# Patient Record
Sex: Female | Born: 1990 | Race: White | Hispanic: No | Marital: Single | Smoking: Former smoker
Health system: Southern US, Community
[De-identification: ages and names within clinical notes are randomized; demographics above are authoritative.]

## PROBLEM LIST (undated history)

## (undated) HISTORY — PX: HERNIA REPAIR: SHX51

---

## 2015-03-17 ENCOUNTER — Emergency Department (HOSPITAL_BASED_OUTPATIENT_CLINIC_OR_DEPARTMENT_OTHER): Payer: Self-pay

## 2015-03-17 ENCOUNTER — Emergency Department (HOSPITAL_BASED_OUTPATIENT_CLINIC_OR_DEPARTMENT_OTHER)
Admission: EM | Admit: 2015-03-17 | Discharge: 2015-03-17 | Disposition: A | Discharge status: Home / Self Care | Payer: Self-pay | Attending: Emergency Medicine | Admitting: Emergency Medicine

## 2015-03-17 ENCOUNTER — Encounter (HOSPITAL_BASED_OUTPATIENT_CLINIC_OR_DEPARTMENT_OTHER): Payer: Self-pay | Admitting: *Deleted

## 2015-03-17 DIAGNOSIS — Z87891 Personal history of nicotine dependence: Secondary | ICD-10-CM | POA: Insufficient documentation

## 2015-03-17 DIAGNOSIS — M79622 Pain in left upper arm: Secondary | ICD-10-CM | POA: Insufficient documentation

## 2015-03-17 DIAGNOSIS — R109 Unspecified abdominal pain: Secondary | ICD-10-CM

## 2015-03-17 DIAGNOSIS — R1013 Epigastric pain: Secondary | ICD-10-CM | POA: Insufficient documentation

## 2015-03-17 DIAGNOSIS — R1012 Left upper quadrant pain: Secondary | ICD-10-CM | POA: Insufficient documentation

## 2015-03-17 DIAGNOSIS — Z3202 Encounter for pregnancy test, result negative: Secondary | ICD-10-CM | POA: Insufficient documentation

## 2015-03-17 DIAGNOSIS — M545 Low back pain: Secondary | ICD-10-CM | POA: Insufficient documentation

## 2015-03-17 LAB — CBC WITH DIFFERENTIAL/PLATELET
Basophils Absolute: 0 10*3/uL (ref 0.0–0.1)
Basophils Relative: 0 %
Eosinophils Absolute: 0.3 10*3/uL (ref 0.0–0.7)
Eosinophils Relative: 4 %
HCT: 40 % (ref 36.0–46.0)
Hemoglobin: 13.3 g/dL (ref 12.0–15.0)
Lymphocytes Relative: 32 %
Lymphs Abs: 2.4 10*3/uL (ref 0.7–4.0)
MCH: 30.7 pg (ref 26.0–34.0)
MCHC: 33.3 g/dL (ref 30.0–36.0)
MCV: 92.4 fL (ref 78.0–100.0)
Monocytes Absolute: 0.9 10*3/uL (ref 0.1–1.0)
Monocytes Relative: 12 %
Neutro Abs: 3.8 10*3/uL (ref 1.7–7.7)
Neutrophils Relative %: 52 %
Platelets: 240 10*3/uL (ref 150–400)
RBC: 4.33 MIL/uL (ref 3.87–5.11)
RDW: 11.4 % — ABNORMAL LOW (ref 11.5–15.5)
WBC: 7.5 10*3/uL (ref 4.0–10.5)

## 2015-03-17 LAB — URINALYSIS, ROUTINE W REFLEX MICROSCOPIC
Bilirubin Urine: NEGATIVE
Glucose, UA: NEGATIVE mg/dL
Hgb urine dipstick: NEGATIVE
Ketones, ur: NEGATIVE mg/dL
Leukocytes, UA: NEGATIVE
Nitrite: NEGATIVE
Protein, ur: NEGATIVE mg/dL
Specific Gravity, Urine: 1.027 (ref 1.005–1.030)
pH: 5.5 (ref 5.0–8.0)

## 2015-03-17 LAB — COMPREHENSIVE METABOLIC PANEL
ALT: 17 U/L (ref 14–54)
AST: 21 U/L (ref 15–41)
Albumin: 4.1 g/dL (ref 3.5–5.0)
Alkaline Phosphatase: 74 U/L (ref 38–126)
Anion gap: 8 (ref 5–15)
BUN: 15 mg/dL (ref 6–20)
CO2: 26 mmol/L (ref 22–32)
Calcium: 9.2 mg/dL (ref 8.9–10.3)
Chloride: 104 mmol/L (ref 101–111)
Creatinine, Ser: 0.87 mg/dL (ref 0.44–1.00)
GFR calc Af Amer: >60 mL/min (ref >60)
GFR calc non Af Amer: >60 mL/min (ref >60)
Glucose, Bld: 113 mg/dL — ABNORMAL HIGH (ref 65–99)
Potassium: 3.8 mmol/L (ref 3.5–5.1)
Sodium: 138 mmol/L (ref 135–145)
Total Bilirubin: 0.6 mg/dL (ref 0.3–1.2)
Total Protein: 7.4 g/dL (ref 6.5–8.1)

## 2015-03-17 LAB — PREGNANCY, URINE: Preg Test, Ur: NEGATIVE

## 2015-03-17 LAB — LIPASE, BLOOD: Lipase: 27 U/L (ref 11–51)

## 2015-03-17 MED ORDER — KETOROLAC TROMETHAMINE 15 MG/ML IJ SOLN
15.0000 mg | Freq: Once | INTRAMUSCULAR | Status: AC
  Administered 2015-03-17: 15 mg via INTRAVENOUS
  Filled 2015-03-17: qty 1

## 2015-03-17 NOTE — ED Provider Notes (Signed)
CSN: [id number]     Arrival date & time 03/17/15  0810 History   First MD Initiated Contact with Patient 03/17/15 0827     Chief Complaint  Patient presents with  . Flank Pain      Patient is a 25 y.o. female presenting with flank pain. The history is provided by the patient. No language interpreter was used.  Flank Pain   Kristin Daugherty is a 25 y.o. female who presents to the Emergency Department complaining of flank pain.  She reports 1 week of left-sided mid lower back pain. The pain is dull and constant in nature, worse with palpation. Yesterday she developed left axillary pain that is described as sharp in nature. This pain is worse with movement and deep breaths. She denies any injuries, fevers, cough, hemoptysis, shortness of breath, abdominal pain, nausea, vomiting, dysuria, hematuria. No prior similar symptoms. She is a nonsmoker and does not take any hormones. Symptoms are moderate, constant, worsening.  History reviewed. No pertinent past medical history. Past Surgical History  Procedure Laterality Date  . Hernia repair      as an infant   No family history on file. Social History  Substance Use Topics  . Smoking status: Former Games developer  . Smokeless tobacco: Never Used  . Alcohol Use: Yes     Comment: occassionally   OB History    No data available     Review of Systems  Genitourinary: Positive for flank pain.  All other systems reviewed and are negative.     Allergies  Review of patient's allergies indicates no known allergies.  Home Medications   Prior to Admission medications   Not on File   BP 144/92 mmHg  Pulse 80  Temp(Src) 98.4 F (36.9 C) (Oral)  Resp 18  Ht  (1.676 m)  Wt 140 lb (63.504 kg)  BMI 22.61 kg/m2  SpO2 100%  LMP 03/03/2015 Physical Exam  Constitutional: She is oriented to person, place, and time. She appears well-developed and well-nourished.  HENT:  Head: Normocephalic and atraumatic.  Cardiovascular: Normal rate and  regular rhythm.   No murmur heard. Pulmonary/Chest: Effort normal and breath sounds normal. No respiratory distress. She exhibits tenderness.  Abdominal: Soft. There is no rebound and no guarding.  Mild tenderness to palpation over the epigastrium and left upper quadrants as well as mild left CVA tenderness.  Musculoskeletal: She exhibits no edema or tenderness.  Neurological: She is alert and oriented to person, place, and time.  Skin: Skin is warm and dry.  Psychiatric: She has a normal mood and affect. Her behavior is normal.  Nursing note and vitals reviewed.   ED Course  Procedures (including critical care time) Labs Review Labs Reviewed  COMPREHENSIVE METABOLIC PANEL - Abnormal; Notable for the following:    Glucose, Bld 113 (*)    All other components within normal limits  URINALYSIS, ROUTINE W REFLEX MICROSCOPIC (NOT AT [facility]) - Abnormal; Notable for the following:    APPearance CLOUDY (*)    All other components within normal limits  CBC WITH DIFFERENTIAL/PLATELET - Abnormal; Notable for the following:    RDW 11.4 (*)    All other components within normal limits  LIPASE, BLOOD  PREGNANCY, URINE    Imaging Review Dg Chest 2 View  03/17/2015  CLINICAL DATA:  25 year old former smoker who developed left upper back pain/posterior chest pain and left rib pain approximately 1 week ago. No known injury. EXAM: CHEST  2 VIEW COMPARISON:  None. FINDINGS:  Cardiomediastinal silhouette unremarkable. Lungs clear. Bronchovascular markings normal. Pulmonary vascularity normal. No pneumothorax. No pleural effusions. Visualized bony thorax intact. IMPRESSION: Normal examination. Electronically Signed   By: Hulan Saas M.D.   On: 03/17/2015 09:48   I have personally reviewed and evaluated these images and lab results as part of my medical decision-making.   EKG Interpretation None      MDM   Final diagnoses:  Acute left flank pain    Patient here for evaluation of the left  flank and axillary pain. Pain is reproducible on examination. History and presentation is not consistent with renal colic, UTI, pancreatitis.  Doubt PE, PERC Negative. No evidence of pneumonia. Discussed with patient home care for musculokeletal flank/chest wall pain. Discussed ibuprofen or Tylenol available over-the-counter, outpatient follow-up, return precautions.    Tilden Fossa, MD 03/17/15 [phone]

## 2015-03-17 NOTE — ED Notes (Signed)
Patient states she has had left mid back pain for one week.  States yesterday, the pain moved to her left upper quadrant of the abdomen.  Denies fever.

## 2015-03-17 NOTE — Discharge Instructions (Signed)
You can take tylenol or ibuprofen, available over the counter as needed for pain.     Flank Pain Flank pain refers to pain that is located on the side of the body between the upper abdomen and the back. The pain may occur over a short period of time (acute) or may be long-term or reoccurring (chronic). It may be mild or severe. Flank pain can be caused by many things. CAUSES  Some of the more common causes of flank pain include:  Muscle strains.   Muscle spasms.   A disease of your spine (vertebral disk disease).   A lung infection (pneumonia).   Fluid around your lungs (pulmonary edema).   A kidney infection.   Kidney stones.   A very painful skin rash caused by the chickenpox virus (shingles).   Gallbladder disease.  HOME CARE INSTRUCTIONS  Home care will depend on the cause of your pain. In general,  Rest as directed by your caregiver.  Drink enough fluids to keep your urine clear or pale yellow.  Only take over-the-counter or prescription medicines as directed by your caregiver. Some medicines may help relieve the pain.  Tell your caregiver about any changes in your pain.  Follow up with your caregiver as directed. SEEK IMMEDIATE MEDICAL CARE IF:   Your pain is not controlled with medicine.   You have new or worsening symptoms.  Your pain increases.   You have abdominal pain.   You have shortness of breath.   You have persistent nausea or vomiting.   You have swelling in your abdomen.   You feel faint or pass out.   You have blood in your urine.  You have a fever or persistent symptoms for more than 2-3 days.  You have a fever and your symptoms suddenly get worse. MAKE SURE YOU:   Understand these instructions.  Will watch your condition.  Will get help right away if you are not doing well or get worse.   This information is not intended to replace advice given to you by your health care provider. Make sure you discuss any  questions you have with your health care provider.   Document Released: 03/17/2005 Document Revised: 10/19/2011 Document Reviewed: 09/08/2011 Elsevier Interactive Patient Education 2016 [employer].   Emergency Department Resource Guide 1) Find a Doctor and Pay Out of Pocket Although you won't have to find out who is covered by your insurance plan, it is a good idea to ask around and get recommendations. You will then need to call the office and see if the doctor you have chosen will accept you as a new patient and what types of options they offer for patients who are self-pay. Some doctors offer discounts or will set up payment plans for their patients who do not have insurance, but you will need to ask so you aren't surprised when you get to your appointment.  2) Contact Your Local Health Department Not all health departments have doctors that can see patients for sick visits, but many do, so it is worth a call to see if yours does. If you don't know where your local health department is, you can check in your phone book. The CDC also has a tool to help you locate your state's health department, and many state websites also have listings of all of their local health departments.  3) Find a Walk-in Clinic If your illness is not likely to be very severe or complicated, you may want to try a  walk in clinic. These are popping up all over the country in pharmacies, drugstores, and shopping centers. They're usually staffed by nurse practitioners or physician assistants that have been trained to treat common illnesses and complaints. They're usually fairly quick and inexpensive. However, if you have serious medical issues or chronic medical problems, these are probably not your best option.  No Primary Care Doctor: - Call Health Connect at  [phone] - they can help you locate a primary care doctor that  accepts your insurance, provides certain services, etc. - Physician Referral Service-  [phone]  Chronic Pain Problems: Organization         Address  Phone   Notes  [facility]  [phone] Patients need to be referred by their primary care doctor.   Medication Assistance: Organization         Address  Phone   Notes  [facility] Medication [facility] [address] [place], [place] [address] [phone] --Must be a resident of [facility] -- Must have NO insurance coverage whatsoever (no Medicaid/ Medicare, etc.) -- The pt. MUST have a primary care doctor that directs their care regularly and follows them in the community   Med[facility]ist  [phone]   [employer]  [phone]    Agencies that provide inexpensive medical care: Organization         Address  Phone   Notes  [facility]  [phone]   [facility]    [phone]   [facility] [address] [place], [place] [address] [phone]   [facility] [address] [place], [place] [phone]   Planned Parenthood    [phone]   [facility]    [phone]   [facility]  [address], [place] Phone:  [phone], Fax:  [phone] Hours of Operation:  9 am - 6 pm, M-F.  Also accepts Medicaid/Medicare and self-pay.  [facility]  [address], [place] Phone: [phone], Fax: [phone]. Hours of Operation:  8:30 am - 5:30 pm, M-F.  Also accepts Medicaid and self-pay.  [facility] [place] [address] [place] Phone: [phone]   [facility] [address] Natasha Bence [place], [place] [phone], Ext. [phone] Mondays & Thursdays: 7-9 AM.  First 15 patients are seen on a first come, first serve basis.    Medicaid-accepting [facility] Providers:  Organization         Address  Phone   Notes  [facility] [address],  [place] [phone] Also accepts self-pay patients.  [facility] [address] Laurell Josephs [place], [place]  [phone]   [facility] [address], [place] [phone]   [facility] [address] [place] [phone]   Renaye Rakers [address], [place]   [phone] Only accepts [place] Access [place] patients after they have their name applied to their card.   Self-Pay (no insurance) in [facility]:  Organization         Address  Phone   Notes  Sickle Cell Patients, [facility] Internal Medicine [address] [place] [address] [place], [place] [phone]   [facility] Care [address] [place], [place] [phone]   Patrcia Dolly  [facility]  [address], [place] [phone]   [facility]/Dr. Osei-Bonsu  [address] [place] [address], [place] or [address], [place] [phone] Phone number for both [place] and [place] locations is the same.  [facility] [address], [place] [phone]) [phone]   [facility] [address] [place] [address], [place] or [address] [phone]) [phone]) [phone]   [facility] [address], [place] [phone]) [phone], phone; [phone], fax Sees patients 1st and 3rd Saturday of every month.  Must not qualify for public or private insurance (i.e. Medicaid, Medicare, [place] Health Choice, Veterans' Benefits)  Household income should be no more than 200% of the poverty level The clinic cannot treat you if you are pregnant or think you are pregnant  Sexually transmitted diseases are not treated at the clinic.    Dental Care: Organization         Address  Phone  Notes  [facility] [address], [place] [phone]) [phone] Accepts children up to age 321 who are enrolled in  [place] or [place] Health Choice; pregnant women with a Medicaid card; and children who have applied for Medicaid or [place] Health Choice, but were declined, whose parents can pay a reduced fee at time of service.  [facility] [place]  [address], [place] [phone]) [phone] Accepts children up to age 221 who are enrolled in [place] or [place] Health Choice; pregnant women with a Medicaid card; and children who have applied for Medicaid or [place] Health Choice, but were declined, whose parents can pay a reduced fee at time of service.  [facility]  [address], [place] [phone]) [phone] Patients are seen by appointment only. Walk-ins are not accepted. [facility] will see patients 25 years of age and older. Monday - Tuesday (8am-5pm) Most Wednesdays (8:30-5pm) $30 per visit, cash only  [facility]  [address], [place] [facility] [phone]) [phone] Patients are seen by appointment only. Walk-ins are not accepted. [facility] will see patients 25 years of age and older. One Wednesday Evening (Monthly: Volunteer Based).  $30 per visit, cash only  [employer] [facility]  [phone] for adults; Children under age 614, call Graduate Pediatric Dentistry at [phone]) [phone]. Children aged 44-14, please call [phone] to request a pediatric application.  Dental services are provided in all areas of dental care including fillings, crowns and bridges, complete and partial dentures, implants, gum treatment, root canals, and extractions. Preventive care is also provided. Treatment is provided to both adults and children. Patients are selected via a lottery and there is often a waiting list.   [facility] [address], [place]  [phone]) [phone] [email or web address]   [facility] [address], [place], [place] [phone], Ext. [phone] Second and Fourth Thursday of each month, opens at 6:30  AM; Clinic ends at 9 AM.  Patients are seen on a first-come first-served basis, and a limited number are seen during each clinic.   [facility]  [address], [place], [place] [phone]) [phone]   Eligibility Requirements You must have lived in [place], [place], or [place] counties for at least the last three months.   You cannot be eligible for state or federal sponsored healthcare  insurance, including CIGNA, [place], or [employer].   You generally cannot be eligible for healthcare insurance through your employer.    How to apply: Eligibility screenings are held every Tuesday and Wednesday afternoon from 1:00 pm until 4:00 pm. You do not need an appointment for the interview!  [facility] [address], [place], [place] [phone]   [facility] Health Department  [phone]   [facility] Health Department  [phone]   [facility] Health Department  [phone]    Behavioral Health Resources in the Community: Intensive Outpatient Programs Organization         Address  Phone  Notes  [facility] [address] [place], [place] [phone]   [facility] Outpatient [address] [place], [place] [phone]   [facility] [address] [place], [place]  [phone]   [facility] [address],  [place], [place] [phone] or [phone]   Substance Abuse Resources Organization         Address  Phone  Notes  [facility] Drug Services  [phone]   [facility]  [phone]   [facility] [place]  [phone]   Floydene Flock  [phone]   Residential & Outpatient Substance Abuse Program  [phone]   Psychological Services Organization         Address  Phone  Notes  [facility] Health  [phone]   [facility]  [phone]   [facility] Health [address], [place] [phone] or  [phone]    Mobile Crisis Teams Organization         Address  Phone  Notes  [facility], Mobile Crisis Care Unit  [phone]   [facility]  [address] [place], [place] [phone]   Doristine Locks [address] Ali [place] [phone]    Self-Help/Support Groups Organization         Address  Phone             Notes  [facility] [place] - variety of support groups  336- I7437963 Call for more information  Narcotics Anonymous (NA), [facility]aring [facility] [address], [employer] [place]  2 meetings at this location   Statistician         Address  Phone  Notes  [facility] [address],    [place]  [phone]   [facility]  [address], [place] [address], [place], [place] [phone]   [facility] [address] [place], [place] [phone] Admissions: 8am-3pm M-F  [facility] [address],    [place], [place] [phone]   [facility] [address] [place], [place], [place] [phone]   [facility] [address]  [place], [place] [phone]   [facility] - Intensive Outpatient [address]urell Josephs [address], [place], [place] [phone]   [facility] ([facility]iction [facility] [address] [place]  [place], [place] [phone] or [phone]   [facility] ([facility]) [address] [place], [place] [phone] Accepts Medicaid  [facility] [address],  [place] [phone] Substance Abuse/Addiction Treatment   [facility] Organization         Address  Phone  Notes  [facility]  [phone]   Angie Fava, PhD [address] [place], [place]   [phone] or [phone]) [phone])  [phone]   [facility]   [address] Zamariah [place], [place] [phone]   [facility] [address]  [place], [place] [phone] Insurance/Medicaid/sponsorship through [facility] Families [address]                                    [place], [place] [phone] Therapy/tele-psych/case  [facility] [address]   [place], [place] [phone]    Dr. Lolly Mustache  [phone]   [facility]  [employer] [facility] Dept. 1) [address], [place] 2) [address] [place] 3)  [address], [place] [phone]  [phone]   [facility] Child Abuse Hotline 845-558-1947 or [phone] (After Hours)

## 2016-05-23 ENCOUNTER — Emergency Department (INDEPENDENT_AMBULATORY_CARE_PROVIDER_SITE_OTHER): Payer: Self-pay

## 2016-05-23 ENCOUNTER — Other Ambulatory Visit: Payer: Self-pay

## 2016-05-23 ENCOUNTER — Emergency Department
Admission: EM | Admit: 2016-05-23 | Discharge: 2016-05-23 | Disposition: A | Discharge status: Home / Self Care | Payer: Self-pay | Source: Home / Self Care | Attending: Family Medicine | Admitting: Family Medicine

## 2016-05-23 DIAGNOSIS — M940 Chondrocostal junction syndrome [Tietze]: Secondary | ICD-10-CM

## 2016-05-23 DIAGNOSIS — R0789 Other chest pain: Secondary | ICD-10-CM

## 2016-05-23 MED ORDER — PREDNISONE 20 MG PO TABS: ORAL_TABLET | ORAL | 0 refills | Status: AC

## 2016-05-23 NOTE — ED Triage Notes (Signed)
Pt stated that she has had chest tightness for about 1 week.  When it happens, she feels like she can not take a deep breath, and has blurry vision.  Denies history of asthma, and cold symptoms.  Denies fever.

## 2016-05-23 NOTE — ED Provider Notes (Signed)
[facility]    CSN: [id number] Arrival date & time: 05/23/16  1929     History   Chief Complaint Chief Complaint  Patient presents with  . Chest Pain    HPI Kristin Daugherty is a 26 y.o. female.   Patient complains of one week history of constant tightness in her anterior chest over her sternum, and feels like she cannot take a deep breath although she does not feel shortness of breath.  She feels well otherwise.  No cough or recent cold.  No history of asthma.  No chest injury.   The history is provided by the patient.  Chest Pain  Pain location:  Substernal area Pain quality: tightness   Pain radiates to:  Does not radiate Pain severity:  Mild Onset quality:  Sudden Duration:  1 week Timing:  Constant Progression:  Unchanged Chronicity:  New Context: breathing   Relieved by:  Nothing Worsened by:  Movement and deep breathing Ineffective treatments:  None tried Associated symptoms: no abdominal pain, no AICD problem, no anorexia, no anxiety, no cough, no diaphoresis, no dizziness, no dysphagia, no fatigue, no fever, no heartburn, no lower extremity edema, no nausea, no palpitations, no PND, no shortness of breath, no syncope and no vomiting     History reviewed. No pertinent past medical history.  There are no active problems to display for this patient.   Past Surgical History:  Procedure Laterality Date  . HERNIA REPAIR     as an infant    OB History    No data available       Home Medications    Prior to Admission medications   Medication Sig Start Date End Date Taking? Authorizing Provider  predniSONE (DELTASONE) 20 MG tablet Take one tab by mouth twice daily for 5 days, then one daily for 3 days. Take with food. 05/23/16   Lattie Haw, MD    Family History History reviewed. No pertinent family history.  Social History Social History  Substance Use Topics  . Smoking status: Former Games developer  . Smokeless tobacco: Never Used  .  Alcohol use Yes     Comment: occassionally     Allergies   Patient has no known allergies.   Review of Systems Review of Systems  Constitutional: Negative for diaphoresis, fatigue and fever.  HENT: Negative for trouble swallowing.   Respiratory: Negative for cough and shortness of breath.   Cardiovascular: Positive for chest pain. Negative for palpitations, syncope and PND.  Gastrointestinal: Negative for abdominal pain, anorexia, heartburn, nausea and vomiting.  Neurological: Negative for dizziness.  All other systems reviewed and are negative.    Physical Exam Triage Vital Signs ED Triage Vitals  Enc Vitals Group     BP 05/23/16 2011 (!) 141/83     Pulse Rate 05/23/16 2011 64     Resp --      Temp 05/23/16 2011 98 F (36.7 C)     Temp Source 05/23/16 2011 Oral     SpO2 05/23/16 2011 100 %     Weight 05/23/16 2011 166 lb (75.3 kg)     Height 05/23/16 2011  (1.676 m)     Head Circumference --      Peak Flow --      Pain Score 05/23/16 2012 0     Pain Loc --      Pain Edu? --      Excl. in GC? --    No data found.  Updated Vital Signs BP (!) 141/83 (BP Location: Left Arm)   Pulse 64   Temp 98 F (36.7 C) (Oral)   Ht  (1.676 m)   Wt 166 lb (75.3 kg)   LMP 05/22/2016   SpO2 100%   BMI 26.79 kg/m   Visual Acuity Right Eye Distance:   Left Eye Distance:   Bilateral Distance:    Right Eye Near:   Left Eye Near:    Bilateral Near:     Physical Exam  Constitutional: She appears well-developed and well-nourished. No distress.  HENT:  Head: Normocephalic.  Right Ear: External ear normal.  Left Ear: External ear normal.  Nose: Nose normal.  Mouth/Throat: Oropharynx is clear and moist.  Eyes: Conjunctivae are normal. Pupils are equal, round, and reactive to light.  Neck: Neck supple.  Cardiovascular: Normal heart sounds.   Pulmonary/Chest: Breath sounds normal. She exhibits tenderness.    Chest:  Distinct tenderness to palpation over the  mid-sternum.   Abdominal: There is no tenderness.  Lymphadenopathy:    She has no cervical adenopathy.  Neurological: She is alert.  Skin: Skin is warm and dry. No rash noted.  Nursing note and vitals reviewed.    UC Treatments / Results  Labs (all labs ordered are listed, but only abnormal results are displayed) Labs Reviewed - No data to display  EKG  EKG Interpretation  Rate:  67 BPM PR:  164 msec QT:  406 msec QTcH:  429 msec QRSD:  114 msec QRS axis:  56 degrees Interpretation:  within normal limits        Radiology Dg Chest 2 View  Result Date: 05/23/2016 CLINICAL DATA:  Chest tightness for 1 week. EXAM: CHEST  2 VIEW COMPARISON:  Chest radiograph March 17, 2015 FINDINGS: Cardiomediastinal silhouette is normal. No pleural effusions or focal consolidations. Trachea projects midline and there is no pneumothorax. Soft tissue planes and included osseous structures are non-suspicious. IMPRESSION: Normal chest. Electronically Signed   By: Awilda Metro M.D.   On: 05/23/2016 20:39    Procedures Procedures (including critical care time)  Medications Ordered in UC Medications - No data to display   Initial Impression / Assessment and Plan / UC Course  I have reviewed the triage vital signs and the nursing notes.  Pertinent labs & imaging results that were available during my care of the patient were reviewed by me and considered in my medical decision making (see chart for details).    Normal EKG and Chest X-ray reassuring. Begin prednisone burst/taper. Apply ice pack for 20 to 30 minutes, 3 to 4 times daily  Continue until pain decreases.  May take Tylenol if needed for pain. Followup with Dr. Rodney Langton or Dr. Clementeen [facility] ([facility]) if not improving about two weeks.     Final Clinical Impressions(s) / UC Diagnoses   Final diagnoses:  Costochondritis, acute    New Prescriptions New Prescriptions   PREDNISONE (DELTASONE) 20 MG  TABLET    Take one tab by mouth twice daily for 5 days, then one daily for 3 days. Take with food.     Lattie Haw, MD 06/04/16 1349

## 2016-05-23 NOTE — Discharge Instructions (Signed)
Apply ice pack for 20 to 30 minutes, 3 to 4 times daily  Continue until pain decreases. May take Tylenol if needed for pain. 

## 2017-07-24 IMAGING — DX DG CHEST 2V
2 series · 2 of 2 positions shown · non-contrast
Comparison: None.

CLINICAL DATA: 25-year-old former smoker who developed left upper
back pain/posterior chest pain and left rib pain approximately 1
week ago. No known injury.

EXAM:
CHEST  2 VIEW

[chest pa]
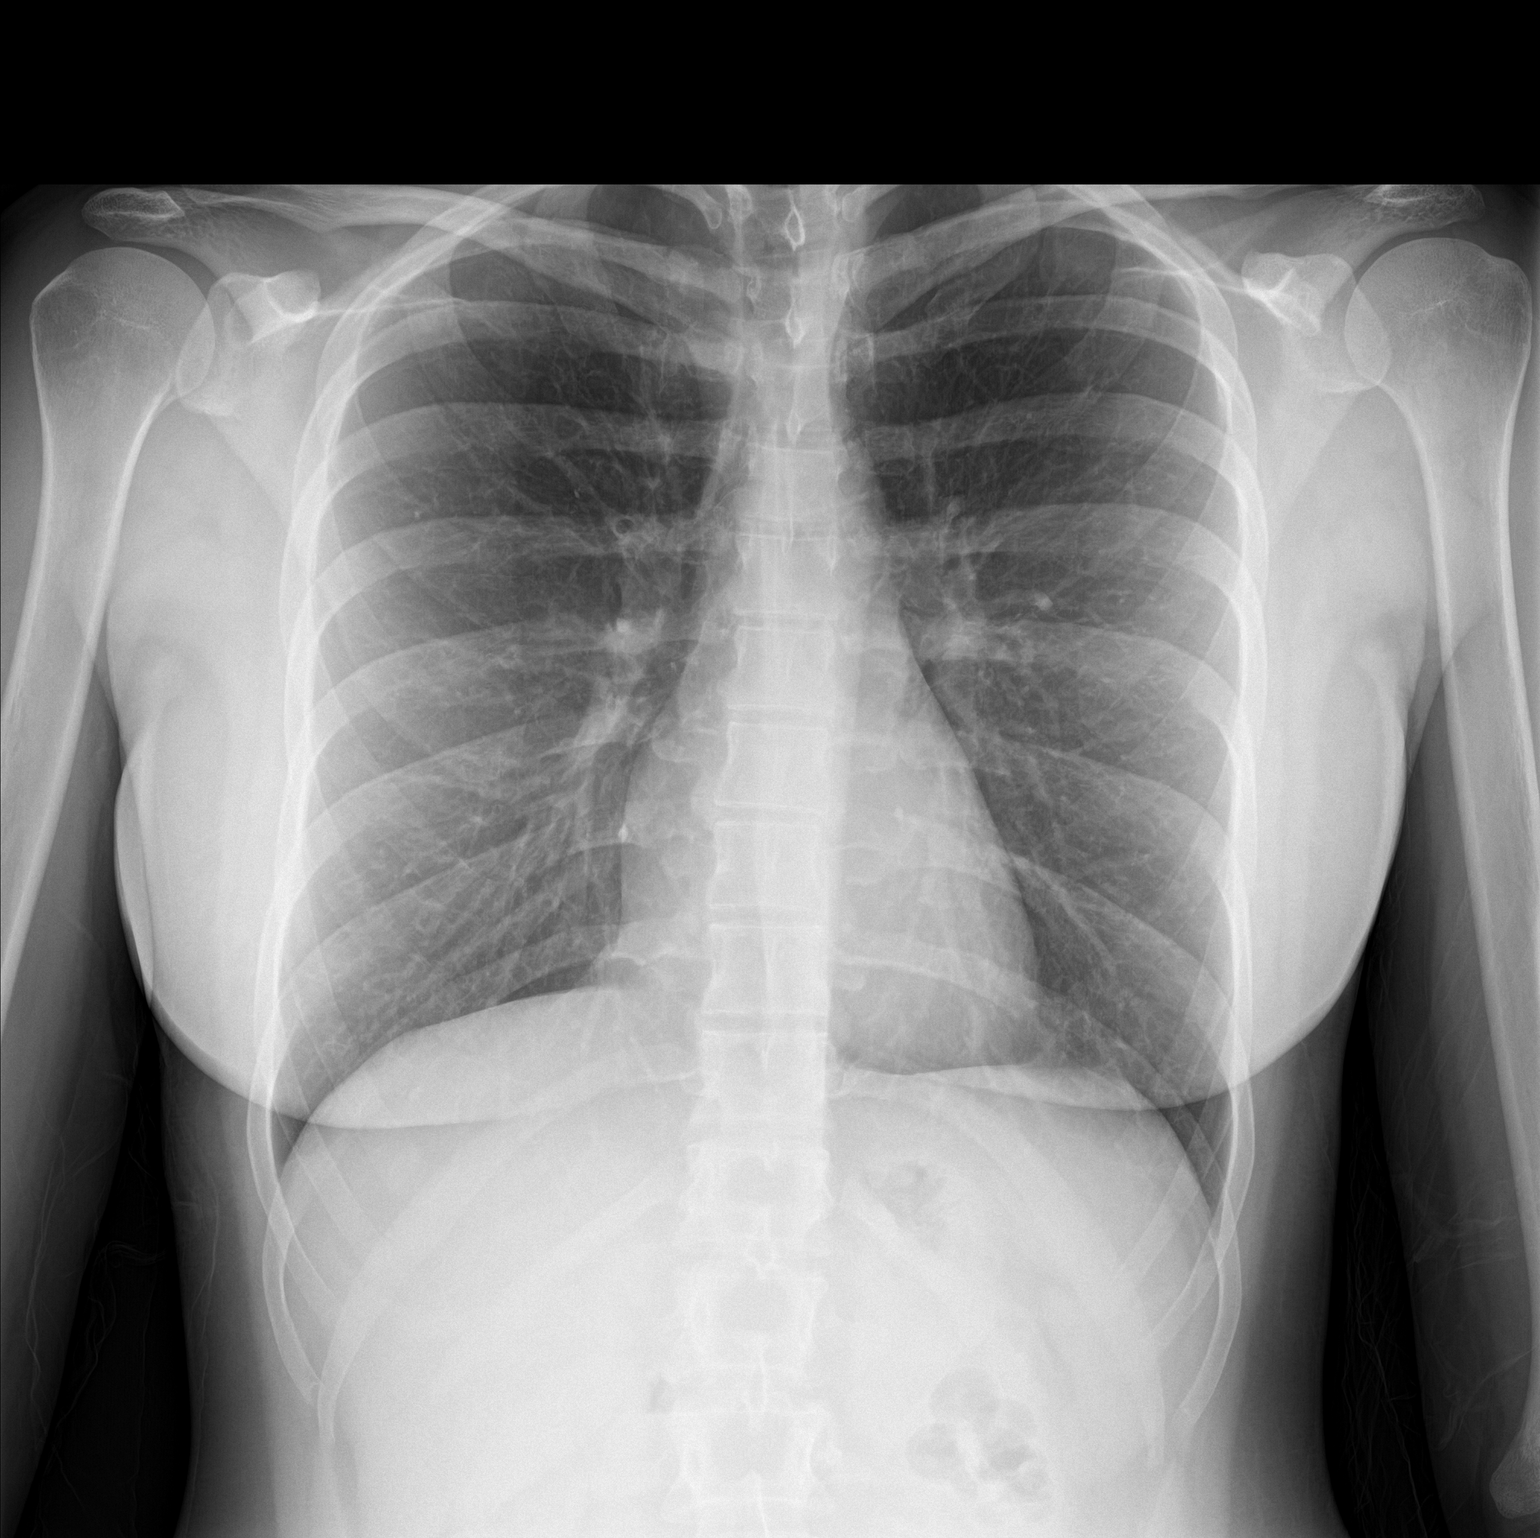

[chest lat]
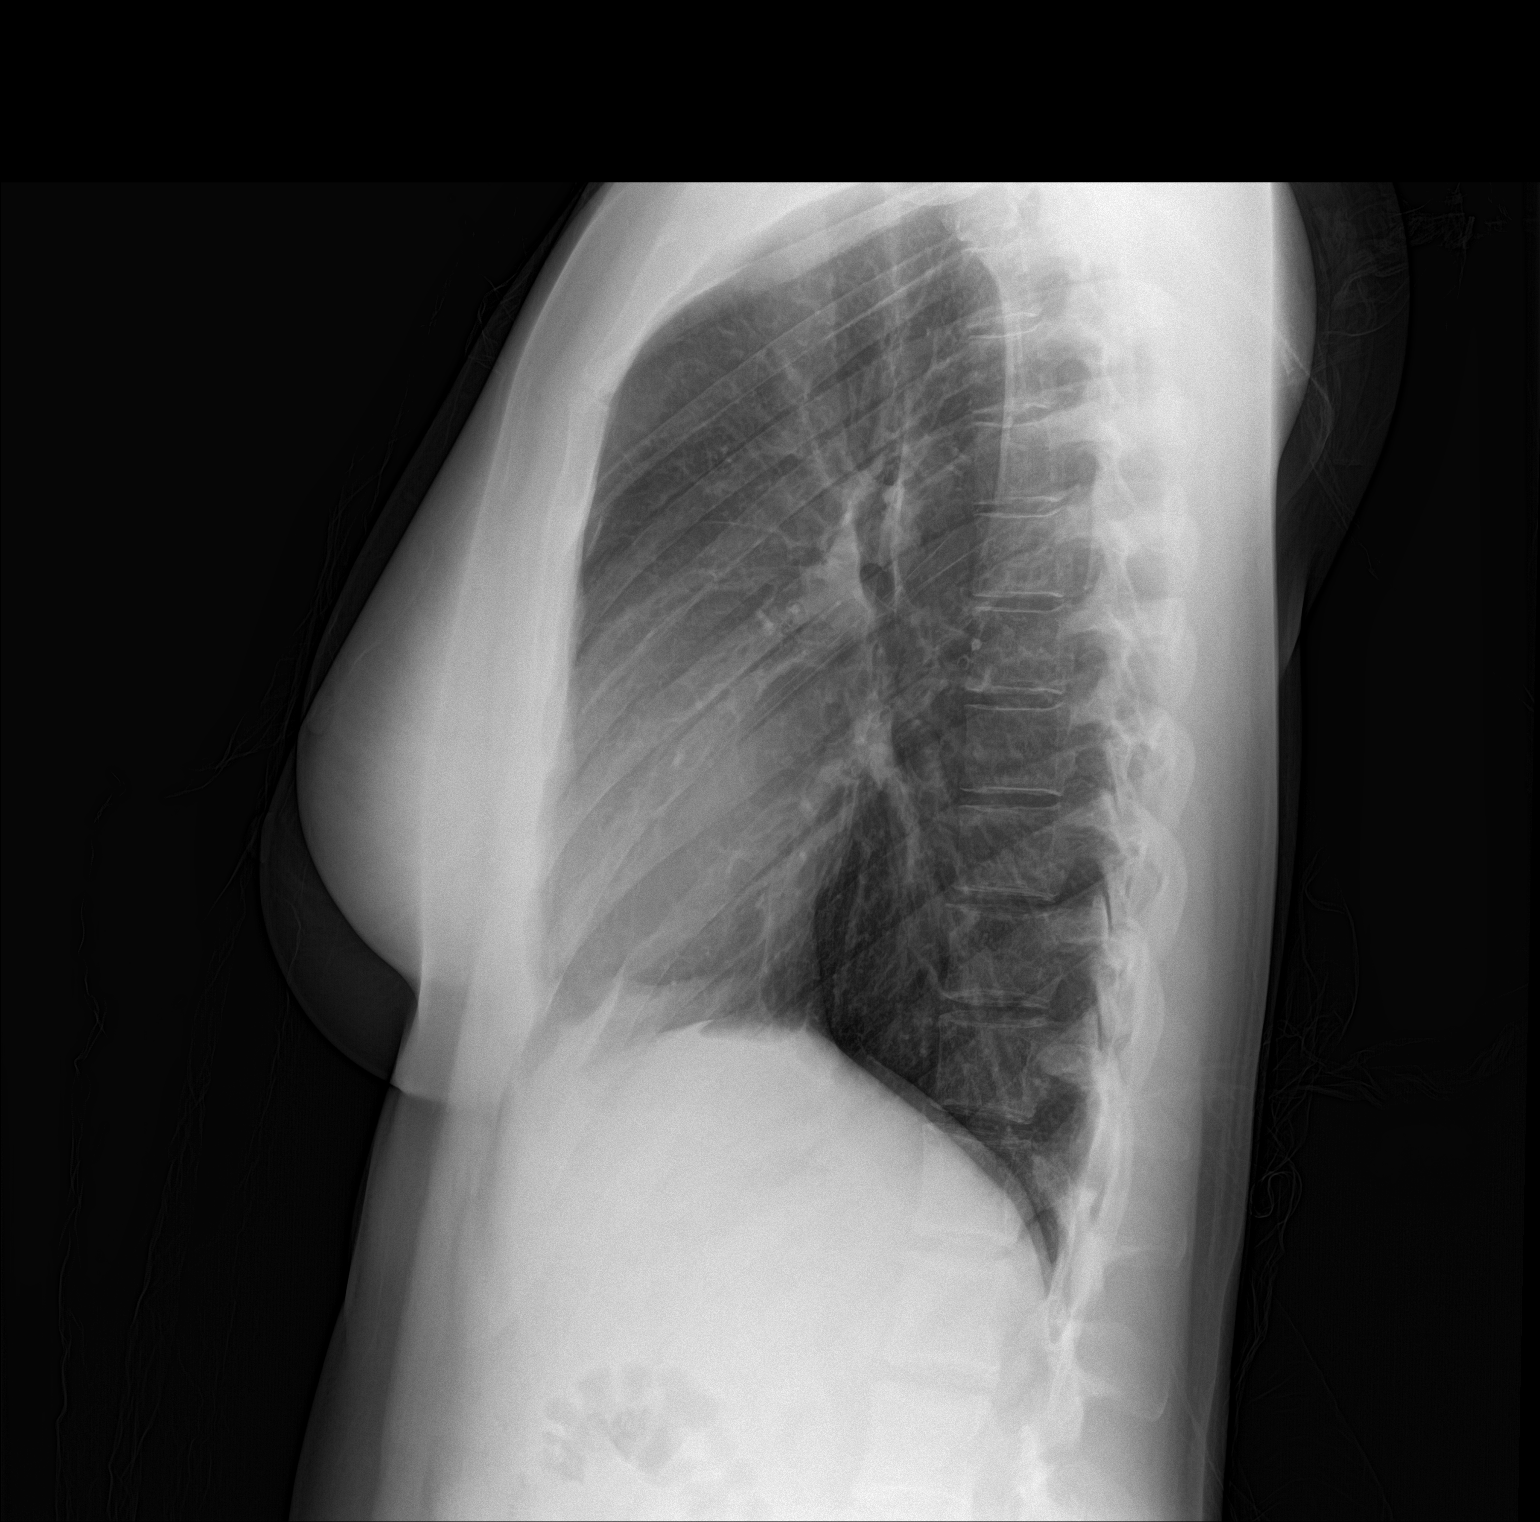

[2 of 2 positions shown; findings below may reference images not displayed]

FINDINGS: Cardiomediastinal silhouette unremarkable. Lungs clear.
Bronchovascular markings normal. Pulmonary vascularity normal. No
pneumothorax. No pleural effusions. Visualized bony thorax intact.
IMPRESSION: Normal examination.
